# Patient Record
Sex: Female | Born: 1995 | Race: Black or African American | Hispanic: No | Marital: Single | Smoking: Never smoker
Health system: Southern US, Community
[De-identification: ages and names within clinical notes are randomized; demographics above are authoritative.]

---

## 2019-01-05 ENCOUNTER — Emergency Department (HOSPITAL_COMMUNITY): Payer: 59

## 2019-01-05 ENCOUNTER — Encounter (HOSPITAL_COMMUNITY): Payer: Self-pay | Admitting: Emergency Medicine

## 2019-01-05 ENCOUNTER — Emergency Department (HOSPITAL_COMMUNITY)
Admission: EM | Admit: 2019-01-05 | Discharge: 2019-01-05 | Disposition: A | Discharge status: Home / Self Care | Payer: 59 | Attending: Emergency Medicine | Admitting: Emergency Medicine

## 2019-01-05 ENCOUNTER — Other Ambulatory Visit: Payer: Self-pay

## 2019-01-05 DIAGNOSIS — R0789 Other chest pain: Secondary | ICD-10-CM | POA: Insufficient documentation

## 2019-01-05 MED ORDER — NAPROXEN 500 MG PO TABS: 500.0000 mg | ORAL_TABLET | Freq: Two times a day (BID) | ORAL | 0 refills | Status: DC

## 2019-01-05 NOTE — Discharge Instructions (Signed)
Take naproxen 2 times a day with meals.  Do not take other anti-inflammatories at the same time (Advil, Motrin, ibuprofen, Aleve). You may supplement with Tylenol if you need further pain control. Use muscle cream such as Salonpas, icy hot, BenGay, Biofreeze to help with muscle aches, stiffness, and soreness.   Use ice packs or heating pads if this helps control your pain. You will likely have continued muscle stiffness and soreness over the next couple days.  Follow-up with primary care in 1 week if your symptoms are not improving. Return to the emergency room if you develop vision changes, vomiting, slurred speech, numbness, loss of bowel or bladder control, or any new or worsening symptoms.
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
## 2019-01-05 NOTE — ED Notes (Signed)
Patient left before discharge process could be completed and without the AVS/ RX.
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
## 2019-04-02 ENCOUNTER — Ambulatory Visit (HOSPITAL_COMMUNITY)
Admission: EM | Admit: 2019-04-02 | Discharge: 2019-04-02 | Disposition: A | Discharge status: Home / Self Care | Payer: 59 | Attending: Family Medicine | Admitting: Family Medicine

## 2019-04-02 ENCOUNTER — Encounter (HOSPITAL_COMMUNITY): Payer: Self-pay

## 2019-04-02 ENCOUNTER — Other Ambulatory Visit: Payer: Self-pay

## 2019-04-02 DIAGNOSIS — M546 Pain in thoracic spine: Secondary | ICD-10-CM

## 2019-04-02 MED ORDER — NAPROXEN 500 MG PO TABS: 500.0000 mg | ORAL_TABLET | Freq: Two times a day (BID) | ORAL | 0 refills | Status: AC

## 2019-04-02 MED ORDER — NAPROXEN 500 MG PO TABS: 500.0000 mg | ORAL_TABLET | Freq: Two times a day (BID) | ORAL | 0 refills | Status: DC
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
## 2019-04-02 NOTE — Discharge Instructions (Addendum)
Light and regular activity and stretching as tolerated.  Naproxen twice a day, restart taking this at night, take with food.  If symptoms worsen please return, if persistent please follow up with sports medicine for further management
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
